# Patient Record
Sex: Male | Born: 1950 | Race: White | Hispanic: No | Marital: Married | Smoking: Current every day smoker
Health system: Southern US, Community
[De-identification: ages and names within clinical notes are randomized; demographics above are authoritative.]

## PROBLEM LIST (undated history)

## (undated) DIAGNOSIS — C801 Malignant (primary) neoplasm, unspecified: Secondary | ICD-10-CM

## (undated) DIAGNOSIS — Z9221 Personal history of antineoplastic chemotherapy: Secondary | ICD-10-CM

## (undated) HISTORY — PX: PORT A CATH INJECTION (ARMC HX): HXRAD1731

## (undated) HISTORY — PX: OTHER SURGICAL HISTORY: SHX169

---

## 1989-06-15 HISTORY — PX: BACK SURGERY: SHX140

## 2017-12-03 ENCOUNTER — Other Ambulatory Visit: Payer: Self-pay | Admitting: Surgery

## 2017-12-03 DIAGNOSIS — C20 Malignant neoplasm of rectum: Secondary | ICD-10-CM

## 2017-12-13 ENCOUNTER — Ambulatory Visit
Admission: RE | Admit: 2017-12-13 | Discharge: 2017-12-13 | Disposition: A | Discharge status: Home / Self Care | Payer: BLUE CROSS/BLUE SHIELD | Source: Ambulatory Visit | Attending: Surgery | Admitting: Surgery

## 2017-12-13 DIAGNOSIS — C20 Malignant neoplasm of rectum: Secondary | ICD-10-CM

## 2017-12-13 MED ORDER — GADOBENATE DIMEGLUMINE 529 MG/ML IV SOLN
17.0000 mL | Freq: Once | INTRAVENOUS | Status: AC | PRN
  Administered 2017-12-13: 17 mL via INTRAVENOUS

## 2017-12-28 DIAGNOSIS — C20 Malignant neoplasm of rectum: Secondary | ICD-10-CM | POA: Diagnosis not present

## 2018-01-11 DIAGNOSIS — C20 Malignant neoplasm of rectum: Secondary | ICD-10-CM | POA: Diagnosis not present

## 2018-01-13 DIAGNOSIS — Z9221 Personal history of antineoplastic chemotherapy: Secondary | ICD-10-CM

## 2018-01-13 HISTORY — DX: Personal history of antineoplastic chemotherapy: Z92.21

## 2018-01-25 DIAGNOSIS — C20 Malignant neoplasm of rectum: Secondary | ICD-10-CM | POA: Diagnosis not present

## 2018-02-18 ENCOUNTER — Ambulatory Visit (HOSPITAL_COMMUNITY): Admission: RE | Admit: 2018-02-18 | Payer: BLUE CROSS/BLUE SHIELD | Source: Ambulatory Visit | Admitting: Surgery

## 2018-02-18 ENCOUNTER — Encounter (HOSPITAL_COMMUNITY): Admission: RE | Payer: Self-pay | Source: Ambulatory Visit

## 2018-02-18 SURGERY — SIGMOIDOSCOPY, FLEXIBLE
Anesthesia: Monitor Anesthesia Care

## 2018-03-07 ENCOUNTER — Ambulatory Visit: Payer: Self-pay | Admitting: Surgery

## 2018-03-07 DIAGNOSIS — C20 Malignant neoplasm of rectum: Secondary | ICD-10-CM

## 2018-03-15 ENCOUNTER — Encounter (HOSPITAL_COMMUNITY): Payer: Self-pay | Admitting: *Deleted

## 2018-03-15 ENCOUNTER — Other Ambulatory Visit: Payer: Self-pay

## 2018-03-15 NOTE — Progress Notes (Signed)
Patient has questions about bowel prep, patient given traiage phone number at [facility]  To call and clairfy bowel prep orders

## 2018-03-18 ENCOUNTER — Ambulatory Visit (HOSPITAL_COMMUNITY): Admission: RE | Admit: 2018-03-18 | Payer: BLUE CROSS/BLUE SHIELD | Source: Ambulatory Visit | Admitting: Surgery

## 2018-03-18 HISTORY — DX: Malignant (primary) neoplasm, unspecified: C80.1

## 2018-03-18 HISTORY — DX: Personal history of antineoplastic chemotherapy: Z92.21

## 2018-03-18 SURGERY — SIGMOIDOSCOPY, FLEXIBLE
Anesthesia: Monitor Anesthesia Care

## 2018-03-22 ENCOUNTER — Other Ambulatory Visit: Payer: Self-pay

## 2018-03-22 ENCOUNTER — Ambulatory Visit: Payer: Self-pay | Admitting: Surgery

## 2018-03-22 ENCOUNTER — Encounter (HOSPITAL_COMMUNITY): Payer: Self-pay | Admitting: *Deleted

## 2018-03-23 ENCOUNTER — Other Ambulatory Visit: Payer: Self-pay | Admitting: Surgery

## 2018-03-23 DIAGNOSIS — C20 Malignant neoplasm of rectum: Secondary | ICD-10-CM

## 2018-03-23 MED ORDER — BUPIVACAINE LIPOSOME 1.3 % IJ SUSP
20.0000 mL | INTRAMUSCULAR | Status: DC
  Filled 2018-03-23: qty 20

## 2018-03-23 NOTE — Patient Instructions (Signed)
Danny Bruce  03/23/2018   Your procedure is scheduled on: 03-29-18     Report to [facility] Main  Entrance    Report to Admitting at 11:00 AM    Call this number if you have problems the morning of surgery [phone]    Remember: DRINK 2 PRESURGERY ENSURE DRINKS THE NIGHT BEFORE SURGERY AT 1000 PM AND 1 PRESURGERY DRINK THE DAY OF THE PROCEDURE 3 HOURS PRIOR TO SCHEDULED SURGERY. NO SOLIDS AFTER MIDNIGHT THE DAY PRIOR TO THE SURGERY. NOTHING BY MOUTH EXCEPT CLEAR LIQUIDS UNTIL THREE HOURS PRIOR TO SCHEDULED SURGERY. PLEASE FINISH PRESURGERY ENSURE DRINK PER SURGEON ORDER 3 HOURS PRIOR TO SCHEDULED SURGERY TIME WHICH NEEDS TO BE COMPLETED AT 10:00 AM.      CLEAR LIQUID DIET   Foods Allowed                                                                     Foods Excluded  Coffee and tea, regular and decaf                             liquids that you cannot  Plain Jell-O in any flavor                                             see through such as: Fruit ices (not with fruit pulp)                                     milk, soups, orange juice  Iced Popsicles                                    All solid food Carbonated beverages, regular and diet                                    Cranberry, grape and apple juices Sports drinks like Gatorade Lightly seasoned clear broth or consume(fat free) Sugar, honey syrup  Sample Menu Breakfast                                Lunch                                     Supper Cranberry juice                    Beef broth                            Chicken broth Jell-O  Grape juice                           Apple juice Coffee or tea                        Jell-O                                      Popsicle                                                Coffee or tea                        Coffee or tea  _____________________________________________________________________     Take  these medicines the morning of surgery with A SIP OF WATER: Flomax (Tamsulosin)             BRUSH YOUR TEETH MORNING OF SURGERY AND RINSE YOUR MOUTH OUT, NO CHEWING GUM CANDY OR MINTS.                       You may not have any metal on your body including hair pins and              piercings  Do not wear jewelry, lotions, powders, cologne or deodorant             Men may shave face and neck.   Do not bring valuables to the hospital. [place].  Contacts, dentures or bridgework may not be worn into surgery.      Patients discharged the day of surgery will not be allowed to drive home.  Name and phone number of your driver:  Special Instructions:Please consume a Clear Liquid Diet on the Day of your Prep              Please read over the following fact sheets you were given: _____________________________________________________________________ [facility] - Preparing for Surgery Before surgery, you can play an important role.  Because skin is not sterile, your skin needs to be as free of germs as possible.  You can reduce the number of germs on your skin by washing with CHG (chlorahexidine gluconate) soap before surgery.  CHG is an antiseptic cleaner which kills germs and bonds with the skin to continue killing germs even after washing. Please DO NOT use if you have an allergy to CHG or antibacterial soaps.  If your skin becomes reddened/irritated stop using the CHG and inform your nurse when you arrive at Short Stay. Do not shave (including legs and underarms) for at least 48 hours prior to the first CHG shower.  You may shave your face/neck. Please follow these instructions carefully:  1.  Shower with CHG Soap the night before surgery and the  morning of Surgery.  2.  If you choose to wash your hair, wash your hair first as usual with your  normal  shampoo.  3.  After you shampoo, rinse your hair and body thoroughly to remove the  shampoo.  4.  Use CHG as you would any other liquid soap.  You can apply chg directly  to the skin and wash                       Gently with a scrungie or clean washcloth.  5.  Apply the CHG Soap to your body ONLY FROM THE NECK DOWN.   Do not use on face/ open                           Wound or open sores. Avoid contact with eyes, ears mouth and genitals (private parts).                       Wash face,  Genitals (private parts) with your normal soap.             6.  Wash thoroughly, paying special attention to the area where your surgery  will be performed.  7.  Thoroughly rinse your body with warm water from the neck down.  8.  DO NOT shower/wash with your normal soap after using and rinsing off  the CHG Soap.                9.  Pat yourself dry with a clean towel.            10.  Wear clean pajamas.            11.  Place clean sheets on your bed the night of your first shower and do not  sleep with pets. Day of Surgery : Do not apply any lotions/deodorants the morning of surgery.  Please wear clean clothes to the hospital/surgery center.  FAILURE TO FOLLOW THESE INSTRUCTIONS MAY RESULT IN THE CANCELLATION OF YOUR SURGERY PATIENT SIGNATURE_________________________________  NURSE SIGNATURE__________________________________  ________________________________________________________________________   Danny Bruce  An incentive spirometer is a tool that can help keep your lungs clear and active. This tool measures how well you are filling your lungs with each breath. Taking long deep breaths may help reverse or decrease the chance of developing breathing (pulmonary) problems (especially infection) following:  A long period of time when you are unable to move or be active. BEFORE THE PROCEDURE   If the spirometer includes an indicator to show your best effort, your nurse or respiratory therapist will set it to a desired goal.  If possible, sit up straight or lean  slightly forward. Try not to slouch.  Hold the incentive spirometer in an upright position. INSTRUCTIONS FOR USE  1. Sit on the edge of your bed if possible, or sit up as far as you can in bed or on a chair. 2. Hold the incentive spirometer in an upright position. 3. Breathe out normally. 4. Place the mouthpiece in your mouth and seal your lips tightly around it. 5. Breathe in slowly and as deeply as possible, raising the piston or the ball toward the top of the column. 6. Hold your breath for 3-5 seconds or for as long as possible. Allow the piston or ball to fall to the bottom of the column. 7. Remove the mouthpiece from your mouth and breathe out normally. 8. Rest for a few seconds and repeat Steps 1 through 7 at least 10 times every 1-2 hours when you are awake. Take your time and take a few normal breaths between deep breaths. 9. The spirometer may include an indicator to show  your best effort. Use the indicator as a goal to work toward during each repetition. 10. After each set of 10 deep breaths, practice coughing to be sure your lungs are clear. If you have an incision (the cut made at the time of surgery), support your incision when coughing by placing a pillow or rolled up towels firmly against it. Once you are able to get out of bed, walk around indoors and cough well. You may stop using the incentive spirometer when instructed by your caregiver.  RISKS AND COMPLICATIONS  Take your time so you do not get dizzy or light-headed.  If you are in pain, you may need to take or ask for pain medication before doing incentive spirometry. It is harder to take a deep breath if you are having pain. AFTER USE  Rest and breathe slowly and easily.  It can be helpful to keep track of a log of your progress. Your caregiver can provide you with a simple table to help with this. If you are using the spirometer at home, follow these instructions: [place] IF:   You are having difficultly  using the spirometer.  You have trouble using the spirometer as often as instructed.  Your pain medication is not giving enough relief while using the spirometer.  You develop fever of 100.5 F (38.1 C) or higher. SEEK IMMEDIATE MEDICAL CARE IF:   You cough up bloody sputum that had not been present before.  You develop fever of 102 F (38.9 C) or greater.  You develop worsening pain at or near the incision site. MAKE SURE YOU:   Understand these instructions.  Will watch your condition.  Will get help right away if you are not doing well or get worse. Document Released: 10/12/2006 Document Revised: 08/24/2011 Document Reviewed: 12/13/2006 ExitCare Patient Information 2014 ExitCare, [place].   ________________________________________________________________________  WHAT IS A BLOOD TRANSFUSION? Blood Transfusion Information  A transfusion is the replacement of blood or some of its parts. Blood is made up of multiple cells which provide different functions.  Red blood cells carry oxygen and are used for blood loss replacement.  White blood cells fight against infection.  Platelets control bleeding.  Plasma helps clot blood.  Other blood products are available for specialized needs, such as hemophilia or other clotting disorders. BEFORE THE TRANSFUSION  Who gives blood for transfusions?   Healthy volunteers who are fully evaluated to make sure their blood is safe. This is blood bank blood. Transfusion therapy is the safest it has ever been in the practice of medicine. Before blood is taken from a donor, a complete history is taken to make sure that person has no history of diseases nor engages in risky social behavior (examples are intravenous drug use or sexual activity with multiple partners). The donor's travel history is screened to minimize risk of transmitting infections, such as malaria. The donated blood is tested for signs of infectious diseases, such as HIV and  hepatitis. The blood is then tested to be sure it is compatible with you in order to minimize the chance of a transfusion reaction. If you or a relative donates blood, this is often done in anticipation of surgery and is not appropriate for emergency situations. It takes many days to process the donated blood. RISKS AND COMPLICATIONS Although transfusion therapy is very safe and saves many lives, the main dangers of transfusion include:   Getting an infectious disease.  Developing a transfusion reaction. This is an allergic reaction to  something in the blood you were given. Every precaution is taken to prevent this. The decision to have a blood transfusion has been considered carefully by your caregiver before blood is given. Blood is not given unless the benefits outweigh the risks. AFTER THE TRANSFUSION  Right after receiving a blood transfusion, you will usually feel much better and more energetic. This is especially true if your red blood cells have gotten low (anemic). The transfusion raises the level of the red blood cells which carry oxygen, and this usually causes an energy increase.  The nurse administering the transfusion will monitor you carefully for complications. HOME CARE INSTRUCTIONS  No special instructions are needed after a transfusion. You may find your energy is better. Speak with your caregiver about any limitations on activity for underlying diseases you may have. SEEK MEDICAL CARE IF:   Your condition is not improving after your transfusion.  You develop redness or irritation at the intravenous (IV) site. SEEK IMMEDIATE MEDICAL CARE IF:  Any of the following symptoms occur over the next 12 hours:  Shaking chills.  You have a temperature by mouth above 102 F (38.9 C), not controlled by medicine.  Chest, back, or muscle pain.  People around you feel you are not acting correctly or are confused.  Shortness of breath or difficulty breathing.  Dizziness and  fainting.  You get a rash or develop hives.  You have a decrease in urine output.  Your urine turns a dark color or changes to pink, red, or brown. Any of the following symptoms occur over the next 10 days:  You have a temperature by mouth above 102 F (38.9 C), not controlled by medicine.  Shortness of breath.  Weakness after normal activity.  The white part of the eye turns yellow (jaundice).  You have a decrease in the amount of urine or are urinating less often.  Your urine turns a dark color or changes to pink, red, or brown. Document Released: 05/29/2000 Document Revised: 08/24/2011 Document Reviewed: 01/16/2008 [facility] Patient Information 2014 [place], [place].  _______________________________________________________________________

## 2018-03-24 ENCOUNTER — Ambulatory Visit (HOSPITAL_COMMUNITY): Payer: BLUE CROSS/BLUE SHIELD | Admitting: Anesthesiology

## 2018-03-24 ENCOUNTER — Encounter (HOSPITAL_COMMUNITY): Payer: Self-pay

## 2018-03-24 ENCOUNTER — Encounter (HOSPITAL_COMMUNITY)
Admission: RE | Disposition: A | Discharge status: Home / Self Care | Payer: Self-pay | Source: Ambulatory Visit | Attending: Surgery

## 2018-03-24 ENCOUNTER — Ambulatory Visit (HOSPITAL_COMMUNITY)
Admission: RE | Admit: 2018-03-24 | Discharge: 2018-03-24 | Disposition: A | Discharge status: Home / Self Care | Payer: BLUE CROSS/BLUE SHIELD | Source: Ambulatory Visit | Attending: Surgery | Admitting: Surgery

## 2018-03-24 ENCOUNTER — Other Ambulatory Visit: Payer: BLUE CROSS/BLUE SHIELD

## 2018-03-24 ENCOUNTER — Other Ambulatory Visit: Payer: Self-pay

## 2018-03-24 DIAGNOSIS — F17210 Nicotine dependence, cigarettes, uncomplicated: Secondary | ICD-10-CM | POA: Diagnosis not present

## 2018-03-24 DIAGNOSIS — Z885 Allergy status to narcotic agent status: Secondary | ICD-10-CM | POA: Insufficient documentation

## 2018-03-24 DIAGNOSIS — K626 Ulcer of anus and rectum: Secondary | ICD-10-CM | POA: Insufficient documentation

## 2018-03-24 DIAGNOSIS — Z1211 Encounter for screening for malignant neoplasm of colon: Secondary | ICD-10-CM | POA: Diagnosis not present

## 2018-03-24 DIAGNOSIS — E785 Hyperlipidemia, unspecified: Secondary | ICD-10-CM | POA: Insufficient documentation

## 2018-03-24 DIAGNOSIS — Z85048 Personal history of other malignant neoplasm of rectum, rectosigmoid junction, and anus: Secondary | ICD-10-CM | POA: Insufficient documentation

## 2018-03-24 DIAGNOSIS — Z79899 Other long term (current) drug therapy: Secondary | ICD-10-CM | POA: Insufficient documentation

## 2018-03-24 HISTORY — PX: FLEXIBLE SIGMOIDOSCOPY: SHX5431

## 2018-03-24 HISTORY — PX: SUBMUCOSAL INJECTION: SHX5543

## 2018-03-24 SURGERY — SIGMOIDOSCOPY, FLEXIBLE
Anesthesia: Monitor Anesthesia Care

## 2018-03-24 MED ORDER — SODIUM CHLORIDE 0.9 % IV SOLN: INTRAVENOUS | Status: DC

## 2018-03-24 MED ORDER — ONDANSETRON HCL 4 MG/2ML IJ SOLN
INTRAMUSCULAR | Status: DC | PRN
  Administered 2018-03-24: 4 mg via INTRAVENOUS

## 2018-03-24 MED ORDER — SPOT INK MARKER SYRINGE KIT
PACK | SUBMUCOSAL | Status: DC | PRN
  Administered 2018-03-24: 1 mL via SUBMUCOSAL

## 2018-03-24 MED ORDER — LACTATED RINGERS IV SOLN
INTRAVENOUS | Status: DC
  Administered 2018-03-24: 12:00:00 via INTRAVENOUS

## 2018-03-24 MED ORDER — PROPOFOL 500 MG/50ML IV EMUL
INTRAVENOUS | Status: DC | PRN
  Administered 2018-03-24: 10 mg via INTRAVENOUS
  Administered 2018-03-24: 50 mg via INTRAVENOUS
  Administered 2018-03-24: 20 mg via INTRAVENOUS

## 2018-03-24 MED ORDER — SPOT INK MARKER SYRINGE KIT
PACK | SUBMUCOSAL | Status: AC
  Filled 2018-03-24: qty 5

## 2018-03-24 MED ORDER — PROPOFOL 500 MG/50ML IV EMUL
INTRAVENOUS | Status: DC | PRN
  Administered 2018-03-24: 125 ug/kg/min via INTRAVENOUS

## 2018-03-24 MED ORDER — PROPOFOL 10 MG/ML IV BOLUS
INTRAVENOUS | Status: AC
  Filled 2018-03-24: qty 40

## 2018-03-24 NOTE — Op Note (Signed)
[facility] Patient Name: Danny Bruce Procedure Date: 03/24/2018 MRN: [id number] Attending MD: Ileana Roup MD, MD Date of Birth: 16-May-1951 CSN: [id number] Age: 67 Admit Type: Outpatient Procedure:                Flexible Sigmoidoscopy Indications:              High risk colon cancer surveillance: Personal                            history of rectal cancer Providers:                Sharon Mt. Sadiel Mota MD, MD, Cleda Daub, RN,                            William Dalton, Technician Referring MD:              Medicines:                Monitored Anesthesia Care Complications:            No immediate complications. Estimated Blood Loss:     Estimated blood loss was minimal. Procedure:                Pre-Anesthesia Assessment:                           - ASA Grade Assessment: II - A patient with mild                            systemic disease.                           - After reviewing the risks and benefits, the                            patient was deemed in satisfactory condition to                            undergo the procedure.                           - The anesthesia plan was to use monitored                            anesthesia care (MAC).                           After obtaining informed consent, the scope was                            passed under direct vision. The CF-[id number] ([id number])                            Olympus adult colonoscope was introduced through                            the anus and advanced to the the rectosigmoid  junction. The flexible sigmoidoscopy was                            accomplished without difficulty. The patient                            tolerated the procedure well. The quality of the                            bowel preparation was adequate. Scope In: 1:34:40 PM Scope Out: 1:48:07 PM Total Procedure Duration: 0 hours 13 minutes 27 seconds  Findings:      The perianal  examination was normal.      The digital rectal exam findings include firmness/?ulcer left lateral;       no palpable mass per se. Pertinent negatives include normal sphincter       tone.      A single (solitary) ten mm ulcer was found in the distal rectum in the       left lateral position consitent with area that had a mass previously. No       bleeding was present. Area was tattooed with an injection of 1 mL of       Spot (carbon black) in 3 separate locations just distal to the ulceration Impression:               - Firmness/?ulcer left lateral; no palpable mass                            per se found on digital rectal exam.                           - A single (solitary) ulcer in the distal rectum.                            Tattooed.                           - No specimens collected. Moderate Sedation:      N/A- Per Anesthesia Care Recommendation:           - High fiber diet indefinitely. Procedure Code(s):        --- Professional ---                           [phone], 106, Sigmoidoscopy, flexible; with directed                            submucosal injection(s), any substance Diagnosis Code(s):        --- Professional ---                           Z85.048, Personal history of other malignant                            neoplasm of rectum, rectosigmoid junction, and anus                           K62.6,  Ulcer of anus and rectum CPT copyright 2018 American Medical Association. All rights reserved. The codes documented in this report are preliminary and upon coder review may  be revised to meet current compliance requirements. Nadeen Landau, MD Ileana Roup MD, MD 03/24/2018 2:06:25 PM This report has been signed electronically. Number of Addenda: 0

## 2018-03-24 NOTE — Transfer of Care (Signed)
Immediate Anesthesia Transfer of Care Note  Patient: Danny Bruce  Procedure(s) Performed: FLEXIBLE SIGMOIDOSCOPY (N/A ) SUBMUCOSAL INJECTION  Patient Location: PACU  Anesthesia Type:MAC  Level of Consciousness: awake, alert  and oriented  Airway & Oxygen Therapy: Patient Spontanous Breathing and Patient connected to face mask oxygen  Post-op Assessment: Report given to RN and Post -op Vital signs reviewed and stable  Post vital signs: Reviewed and stable  Last Vitals:  Vitals Value Taken Time  BP    Temp    Pulse    Resp    SpO2      Last Pain:  Vitals:   03/24/18 1201  TempSrc: Oral  PainSc: 0-No pain         Complications: No apparent anesthesia complications

## 2018-03-24 NOTE — Anesthesia Preprocedure Evaluation (Addendum)
Anesthesia Evaluation  Patient identified by MRN, date of birth, ID band Patient awake    Reviewed: Allergy & Precautions, NPO status , Patient's Chart, lab work & pertinent test results  History of Anesthesia Complications Negative for: history of anesthetic complications  Airway Mallampati: II  TM Distance: >3 FB Neck ROM: Full    Dental no notable dental hx. (+) Teeth Intact, Dental Advisory Given   Pulmonary Current Smoker,    Pulmonary exam normal breath sounds clear to auscultation       Cardiovascular negative cardio ROS Normal cardiovascular exam Rhythm:Regular Rate:Normal     Neuro/Psych negative neurological ROS  negative psych ROS   GI/Hepatic negative GI ROS, (+)     substance abuse  marijuana use,   Endo/Other  negative endocrine ROS  Renal/GU negative Renal ROS  negative genitourinary   Musculoskeletal negative musculoskeletal ROS (+)   Abdominal   Peds negative pediatric ROS (+)  Hematology negative hematology ROS (+)   Anesthesia Other Findings   Reproductive/Obstetrics negative OB ROS                            Anesthesia Physical Anesthesia Plan  ASA: II  Anesthesia Plan: MAC   Post-op Pain Management:    Induction:   PONV Risk Score and Plan: 0 and Propofol infusion and Treatment may vary due to age or medical condition  Airway Management Planned: Nasal Cannula and Natural Airway  Additional Equipment:   Intra-op Plan:   Post-operative Plan:   Informed Consent: I have reviewed the patients History and Physical, chart, labs and discussed the procedure including the risks, benefits and alternatives for the proposed anesthesia with the patient or authorized representative who has indicated his/her understanding and acceptance.   Dental advisory given  Plan Discussed with: Surgeon and CRNA  Anesthesia Plan Comments:        Anesthesia Quick  Evaluation

## 2018-03-24 NOTE — Anesthesia Procedure Notes (Signed)
Date/Time: 03/24/2018 1:22 PM Performed by: Glory Buff, CRNA Oxygen Delivery Method: Simple face mask

## 2018-03-24 NOTE — H&P (Signed)
CC: Newly diagnosed low rectal cancer - here for flexible sigmoidoscopy  HPI: Mr. Waterhouse is a very pleasant [phone] with hx of tobacco and alcohol abuse, HLD here today for flex sig. He was seen for evaluation in the office 01/2018 for a hx of a rectal cancer that was found on screening colonoscopy by Dr. Melina Copa and Tia Alert 11/2017. Following this he underwent staging CTs which revealed no evidence of metastatic disease. CT chest officially pending. He has completed 5.5 wk course of chemoradiation- finished 02/04/18. He tolerated this therapy well with the exception of skin burns and irritation on the perineum. He denies any issues with constipation, nausea/vomiting, by mouth intolerance. He states he can comfortably climb 2 flights of stairs without shortness of breath or chest pain  PMH: Alcohol abuse, tobacco abuse, hyperlipidemia (well controlled on diet)  PSH: Back surgery and left upper extremity surgery. He denies a prior abdominal operations  FHx: Sister had gastric cancer  Social: He smokes 1 pack per day and has done so for approximately 30 years. He drinks a sixpack of beer per day. He uses marijuana daily. He is retired. 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  ROS: A comprehensive 10 system review of systems was completed with the patient and pertinent findings as noted above.  Past Medical History:  Diagnosis Date  . Cancer (HCC)    rectal, tumor   . History of chemotherapy 01/2018   radiation also x 6 weeks    Past Surgical History:  Procedure Laterality Date  . arm redone Left AGE 17   SURGERY DONE  . arm reset Left age 72  . BACK SURGERY  1991   x 2 L 4 to L 5  . colonscopy  may 2019 last one  . PORT A CATH INJECTION ([place] [facility])     right chest    History reviewed. No pertinent family history.  Social:  reports that he has been smoking cigarettes. He has never used smokeless tobacco. He reports that he drinks alcohol. He  reports that he has current or past drug history. Drug: Marijuana.  Allergies:  Allergies  Allergen Reactions  . Caffeine     "heart races"  . Stadol [Butorphanol] Other (See Comments)    No help with pain, up all night  . Toradol [Ketorolac Tromethamine] Other (See Comments)    No help with pain, up all night    Medications: I have reviewed the patient's current medications.  No results found for this or any previous visit (from the past 48 hour(s)).  No results found.  ROS - all of the below systems have been reviewed with the patient and positives are indicated with bold text General: chills, fever or night sweats Eyes: blurry vision or double vision ENT: epistaxis or sore throat Allergy/Immunology: itchy/watery eyes or nasal congestion Hematologic/Lymphatic: bleeding problems, blood clots or swollen lymph nodes Endocrine: temperature intolerance or unexpected weight changes Breast: new or changing breast lumps or nipple discharge Resp: cough, shortness of breath, or wheezing CV: chest pain or dyspnea on exertion GI: as per HPI GU: dysuria, trouble voiding, or hematuria MSK: joint pain or joint stiffness Neuro: TIA or stroke symptoms Derm: pruritus and skin lesion changes Psych: anxiety and depression  PE Blood pressure (!) 151/78, pulse 91, temperature 97.9 F (36.6 C), temperature source Oral, resp. rate 15, height 5\' 10"  (1.778 m), weight 81.6 kg, SpO2 99 %. Constitutional: NAD; conversant; no deformities Eyes: Moist conjunctiva; no lid lag;  anicteric; PERRL Neck: Trachea midline; no thyromegaly Lungs: Normal respiratory effort; no tactile fremitus CV: RRR; no palpable thrills; no pitting edema GI: Abd soft, NT/ND; no palpable hepatosplenomegaly MSK: Normal gait; no clubbing/cyanosis Psychiatric: Appropriate affect; alert and oriented x3 Lymphatic: No palpable cervical or axillary lymphadenopathy   A/P: Jak Haggar is an 67 y.o. male with low rectal cancer s/p  neoadjuvant chemoXRT - here for flex sig to determine exact orientation and location of mass  -The anatomy and physiology of the GI tract was discussed at length with the patient. The pathophysiology of rectal cancer was discussed at length again today -The planned procedure, material risks (including, but not limited to, pain, bleeding, need for blood transfusion, damage to surrounding structures, need for additional procedures, worsening of pre-existing medical conditions, pneumonia, heart attack, stroke, death) benefits and alternatives were discussed at length. The patient's questions were answered to his satisfaction, he voiced understanding and elected to proceed with the procedure  Sharon Mt. Dema Severin, M.D. General and Colorectal Surgery [facility]

## 2018-03-24 NOTE — Anesthesia Postprocedure Evaluation (Signed)
Anesthesia Post Note  Patient: Danny Bruce  Procedure(s) Performed: FLEXIBLE SIGMOIDOSCOPY (N/A ) SUBMUCOSAL INJECTION     Patient location during evaluation: PACU Anesthesia Type: MAC Level of consciousness: awake and alert Pain management: pain level controlled Vital Signs Assessment: post-procedure vital signs reviewed and stable Respiratory status: spontaneous breathing, nonlabored ventilation and respiratory function stable Cardiovascular status: blood pressure returned to baseline and stable Postop Assessment: no apparent nausea or vomiting Anesthetic complications: no    Last Vitals:  Vitals:   03/24/18 1201  BP: (!) 151/78  Pulse: 91  Resp: 15  Temp: 36.6 C  SpO2: 99%    Last Pain:  Vitals:   03/24/18 1201  TempSrc: Oral  PainSc: 0-No pain                 Brennan Bailey

## 2018-03-24 NOTE — Discharge Instructions (Signed)

## 2018-03-25 ENCOUNTER — Inpatient Hospital Stay (HOSPITAL_COMMUNITY)
Admission: RE | Admit: 2018-03-25 | Discharge: 2018-03-25 | Disposition: A | Discharge status: Home / Self Care | Payer: BLUE CROSS/BLUE SHIELD | Source: Ambulatory Visit

## 2018-03-26 ENCOUNTER — Encounter (HOSPITAL_COMMUNITY): Payer: Self-pay | Admitting: Surgery

## 2018-03-29 ENCOUNTER — Inpatient Hospital Stay (HOSPITAL_COMMUNITY): Admission: RE | Admit: 2018-03-29 | Payer: BLUE CROSS/BLUE SHIELD | Source: Ambulatory Visit | Admitting: Surgery

## 2018-03-29 ENCOUNTER — Encounter (HOSPITAL_COMMUNITY): Admission: RE | Payer: Self-pay | Source: Ambulatory Visit

## 2018-03-29 SURGERY — RESECTION, RECTUM, LOW ANTERIOR, LAPAROSCOPIC
Anesthesia: General

## 2018-05-17 DIAGNOSIS — Z923 Personal history of irradiation: Secondary | ICD-10-CM

## 2018-05-17 DIAGNOSIS — Z9221 Personal history of antineoplastic chemotherapy: Secondary | ICD-10-CM | POA: Diagnosis not present

## 2018-05-17 DIAGNOSIS — C20 Malignant neoplasm of rectum: Secondary | ICD-10-CM

## 2018-06-28 DIAGNOSIS — C20 Malignant neoplasm of rectum: Secondary | ICD-10-CM | POA: Diagnosis not present

## 2018-07-12 DIAGNOSIS — Z923 Personal history of irradiation: Secondary | ICD-10-CM

## 2018-07-12 DIAGNOSIS — C20 Malignant neoplasm of rectum: Secondary | ICD-10-CM

## 2018-07-12 DIAGNOSIS — Z9221 Personal history of antineoplastic chemotherapy: Secondary | ICD-10-CM | POA: Diagnosis not present

## 2018-07-20 ENCOUNTER — Inpatient Hospital Stay
Admission: AD | Admit: 2018-07-20 | Payer: Self-pay | Source: Other Acute Inpatient Hospital | Admitting: Pulmonary Disease

## 2018-07-22 MED ORDER — GENERIC EXTERNAL MEDICATION
5.00 | Status: DC
Start: ? — End: 2018-07-22

## 2018-07-22 MED ORDER — MIDAZOLAM HCL-SODIUM CHLORIDE 100-0.9 MG/100ML-% IV SOLN
1.00 | INTRAVENOUS | Status: DC
Start: ? — End: 2018-07-22

## 2018-07-22 MED ORDER — GENERIC EXTERNAL MEDICATION
10.00 | Status: DC
Start: ? — End: 2018-07-22

## 2018-07-22 MED ORDER — FENTANYL CITRATE-NACL 2.5-0.9 MG/250ML-% IV SOLN
12.50 | INTRAVENOUS | Status: DC
Start: ? — End: 2018-07-22

## 2018-07-22 MED ORDER — SODIUM CHLORIDE 0.9 % IV SOLN
10.00 | INTRAVENOUS | Status: DC
Start: ? — End: 2018-07-22

## 2018-07-22 MED ORDER — FENTANYL CITRATE (PF) 2500 MCG/50ML IJ SOLN
25.00 | INTRAMUSCULAR | Status: DC
Start: ? — End: 2018-07-22

## 2018-08-14 DEATH — deceased

## 2019-10-19 IMAGING — MR MR PELVIS WO/W CM
5 of 8 series · 26 of 48 positions shown · IV contrast (Multihance 17ml)
Comparison: None.

CLINICAL DATA: Newly diagnosed rectal carcinoma.  Staging.

EXAM:
MRI PELVIS WITHOUT AND WITH CONTRAST
TECHNIQUE: Multiplanar multisequence MR imaging of the pelvis was performed
both before and after administration of intravenous contrast. Small
amount of US gel was administered per rectum to optimize tumor
evaluation.
CONTRAST:  17mL MULTIHANCE GADOBENATE DIMEGLUMINE 529 MG/ML IV SOLN

[Series 2: T2 · sagittal · 3.0mm · 0.75mm/px · 6 of 36 slices shown (1 of 3)]
[im 1/36]
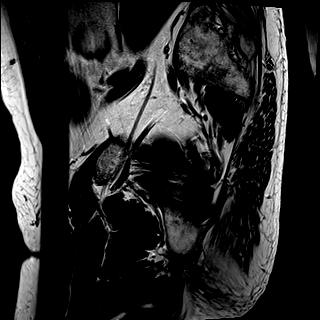
[im 8/36]
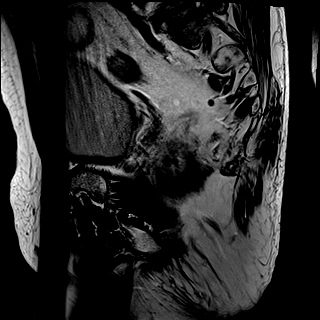
[im 15/36]
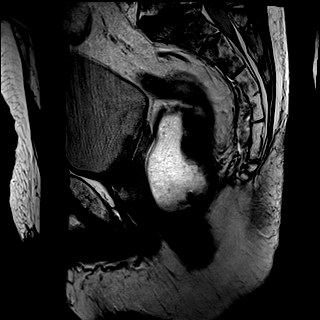
[im 22/36]
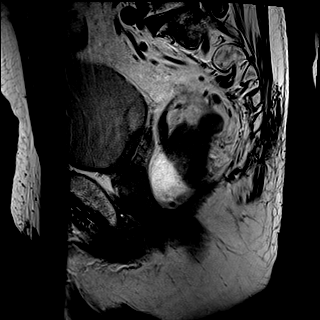
[im 29/36]
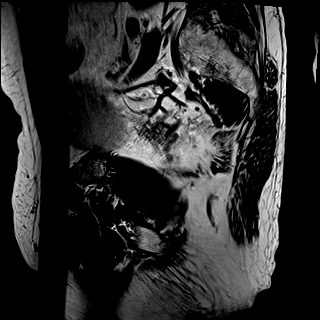
[im 36/36]
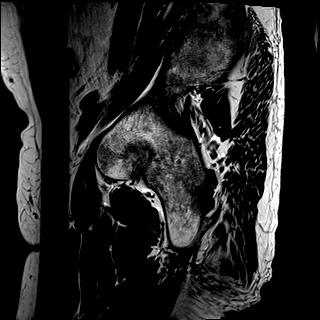

[Series 5: T1 fat-sat · axial · 5.0mm · 1.04mm/px · z∈[-58,+200]mm · 7 of 44 slices shown]
[im 1/44]
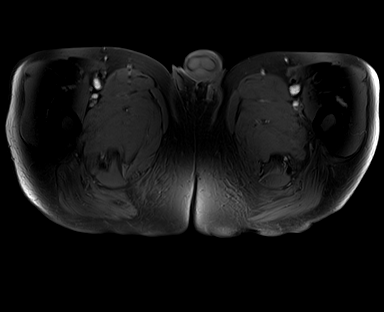
[im 8/44]
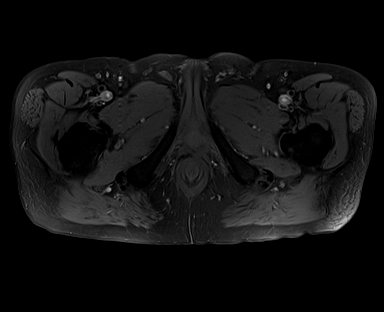
[im 15/44]
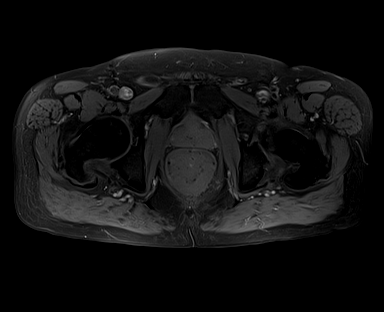
[im 22/44]
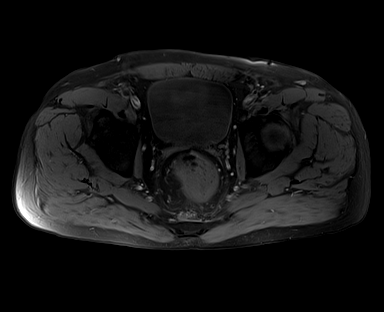
[im 29/44]
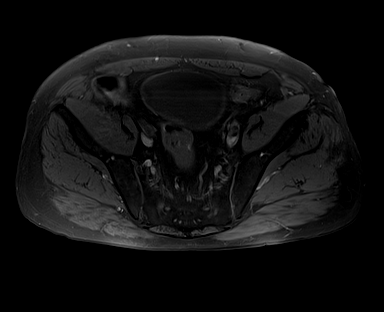
[im 36/44]
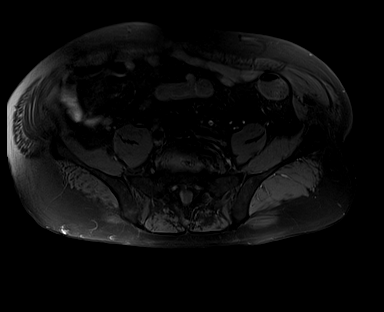
[im 44/44]
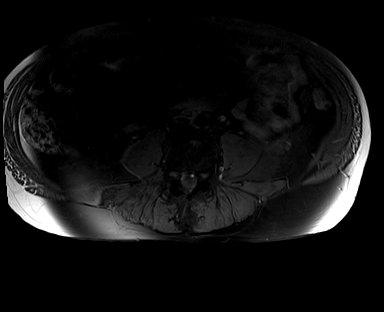

[Series 6: T2 fat-sat · axial · 5.0mm · 1.04mm/px · z∈[-73,+161]mm · 6 of 40 slices shown]
[im 1/40]
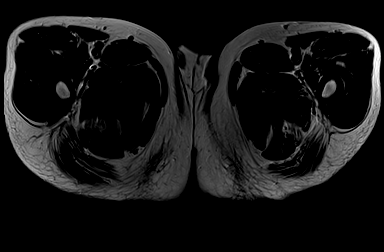
[im 8/40]
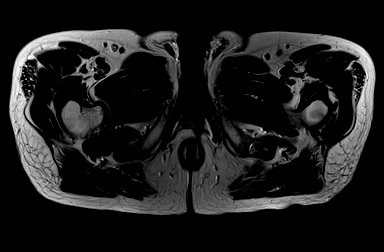
[im 16/40]
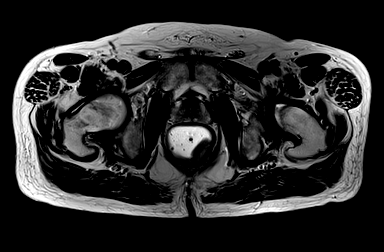
[im 24/40]
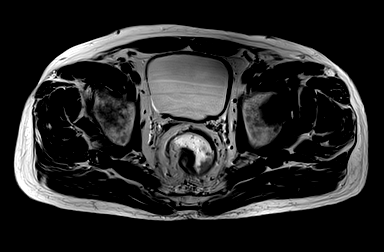
[im 32/40]
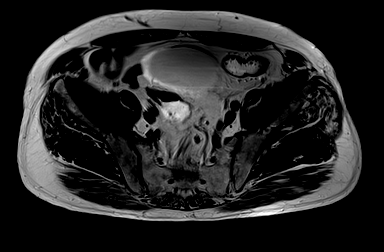
[im 40/40]
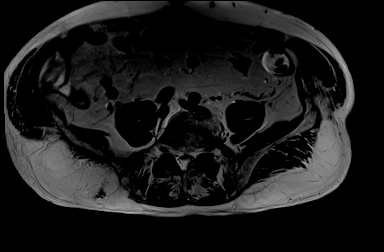

[Series 7: T2 · axial · 3.0mm · 0.38mm/px · z∈[-5,+117]mm · 6 of 42 slices shown (2 of 3)]
[im 1/42]
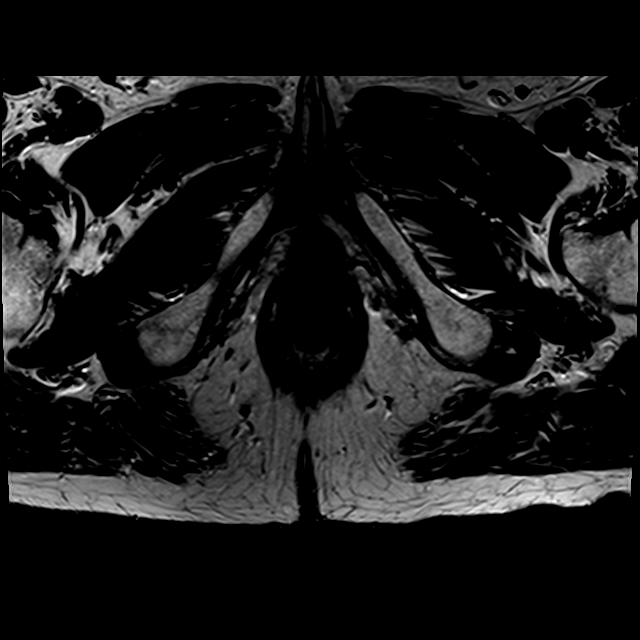
[im 9/42]
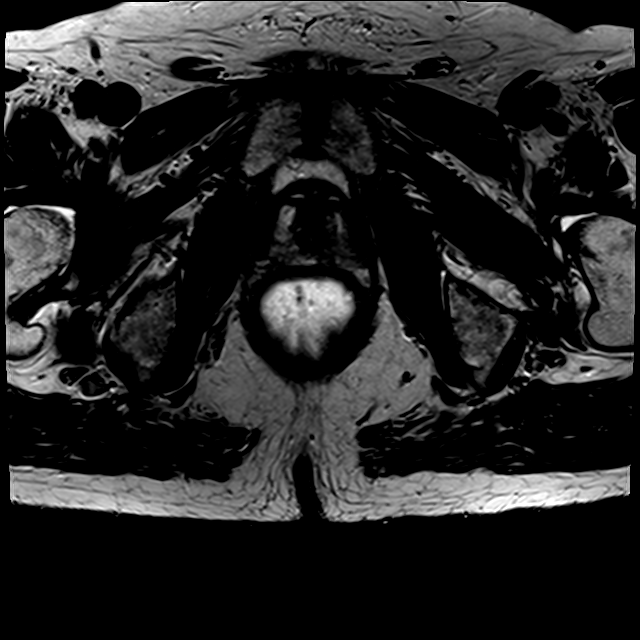
[im 17/42]
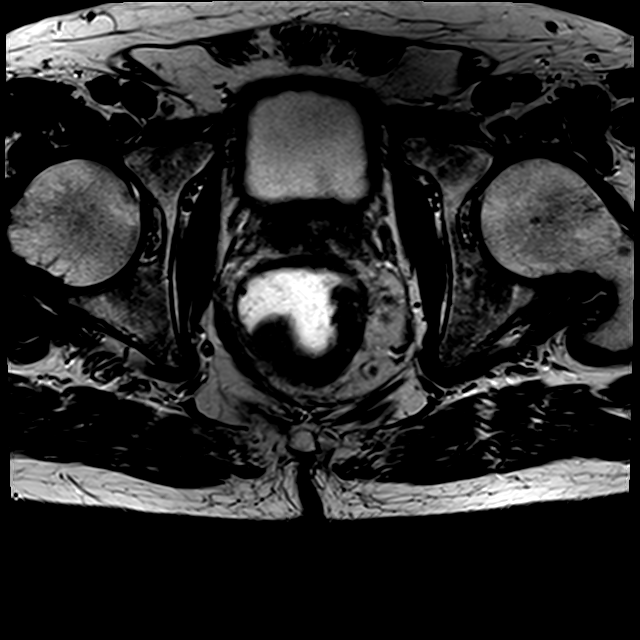
[im 25/42]
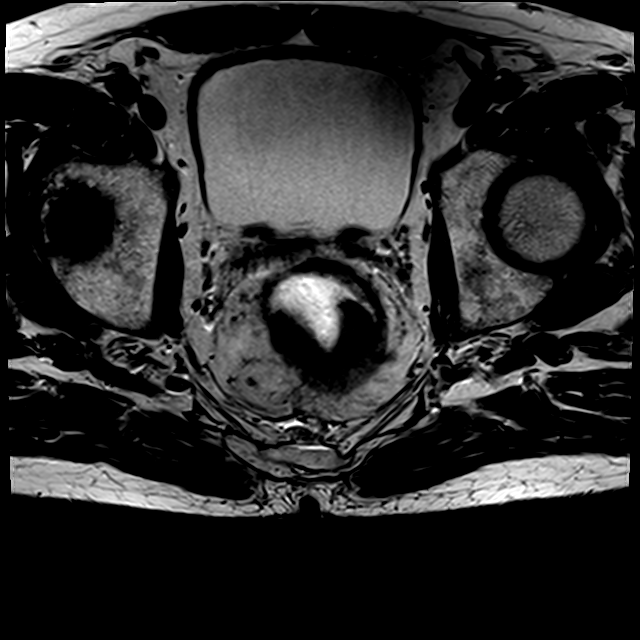
[im 33/42]
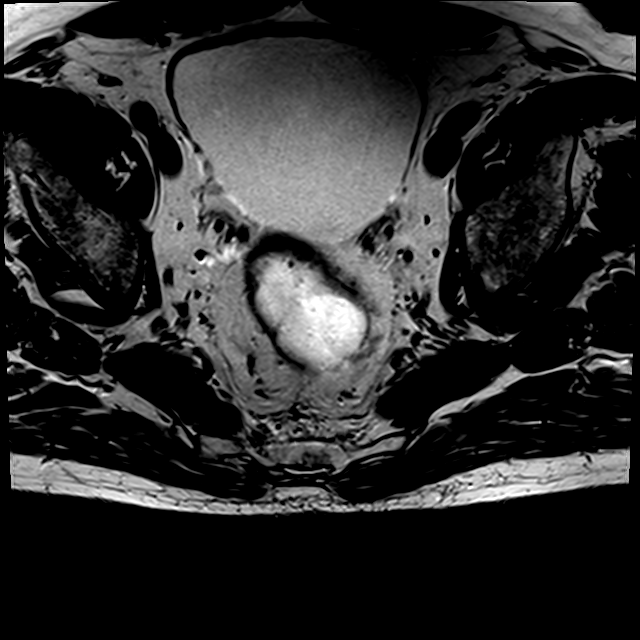
[im 42/42]
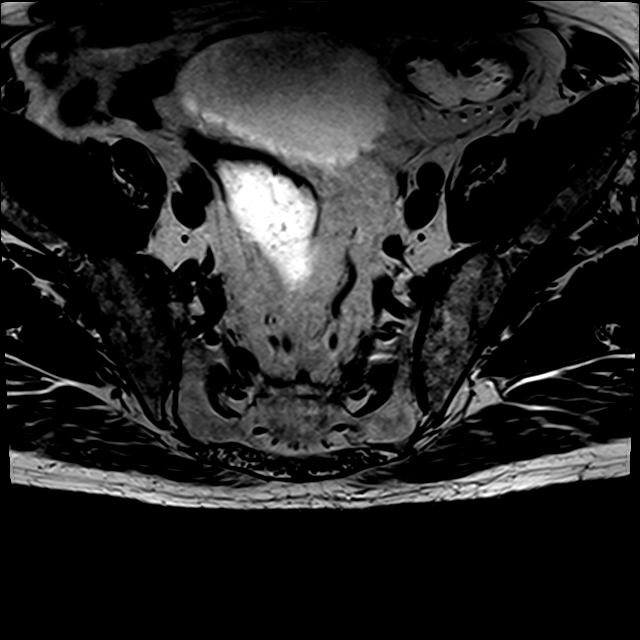

[Series 8: T2 · coronal · 3.0mm · 0.38mm/px · 1 of 42 slices shown (3 of 3)]
[im 1/42]
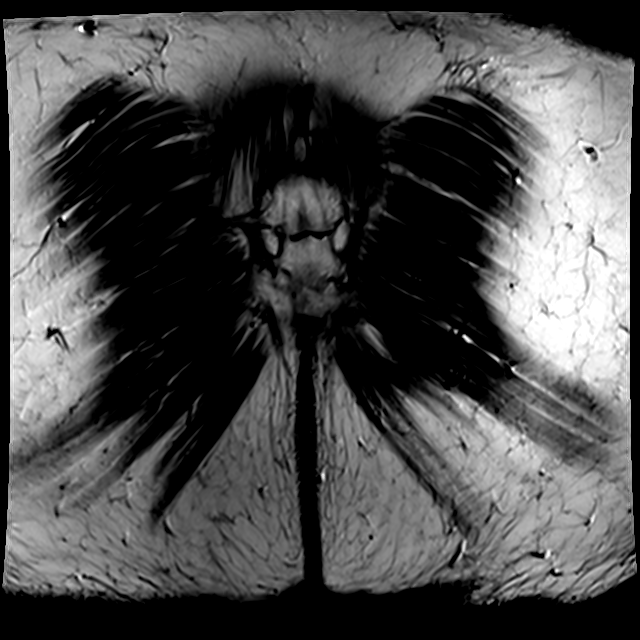

[26 of 48 positions shown; findings below may reference images not displayed]

FINDINGS: TUMOR LOCATION

Location from Anal Verge: Lower third

Shortest Distance from Tumor to Anal Sphincter: 2.5 cm

TUMOR DESCRIPTION

Circumferential Extent: Left lateral and posterior walls from 1 to 9
o'clock positions

Tumor Length: 6.2 cm

T - CATEGORY

Extension through Muscularis Propria: Approximately 10 cm
posteriorly; T3c

Shortest Distance of any tumor/node from Mesorectal Fascia: 8 mm
from left lateral perirectal lymph node on image [DATE].

Extramural Vascular Invasion/Tumor Thrombus:  No

Invasion of Anterior Peritoneal Reflection:  No

Involvement of Adjacent Organs or Pelvic Sidewall: No

Levator Ani Involvement:  No

N - CATEGORY

Mesorectal Lymph Nodes >=5mm: 5 lymph nodes measuring 5-7 mm in size
are seen in the left perirectal region and sigmoid mesocolon = N2

Extra-mesorectal Lymphadenopathy:  No

Other:  None.
IMPRESSION: Rectal adenocarcinoma T stage:  T3c

Rectal adenocarcinoma N stage:  N2

Distance from tumor to the anal sphincter is 2.5 cm.
